# Patient Record
Sex: Female | Born: 1940 | Race: White | Hispanic: No
Health system: Southern US, Community
[De-identification: ages and names within clinical notes are randomized; demographics above are authoritative.]

---

## 2005-07-26 ENCOUNTER — Emergency Department: Payer: Self-pay | Admitting: Emergency Medicine

## 2005-07-26 ENCOUNTER — Other Ambulatory Visit: Payer: Self-pay

## 2005-07-27 ENCOUNTER — Ambulatory Visit: Payer: Self-pay | Admitting: Emergency Medicine

## 2005-08-01 ENCOUNTER — Ambulatory Visit: Payer: Self-pay | Admitting: General Surgery

## 2009-05-14 ENCOUNTER — Ambulatory Visit: Payer: Self-pay | Admitting: Family Medicine

## 2009-05-17 ENCOUNTER — Inpatient Hospital Stay: Payer: Self-pay | Admitting: Internal Medicine

## 2009-05-22 ENCOUNTER — Emergency Department: Payer: Self-pay | Admitting: Emergency Medicine

## 2010-02-22 ENCOUNTER — Encounter: Payer: Self-pay | Admitting: Pulmonary Disease

## 2010-03-17 ENCOUNTER — Encounter: Payer: Self-pay | Admitting: Pulmonary Disease

## 2010-04-16 ENCOUNTER — Encounter: Payer: Self-pay | Admitting: Pulmonary Disease

## 2010-05-17 ENCOUNTER — Encounter: Payer: Self-pay | Admitting: Pulmonary Disease

## 2010-06-16 ENCOUNTER — Encounter: Payer: Self-pay | Admitting: Pulmonary Disease

## 2011-09-18 DIAGNOSIS — C44721 Squamous cell carcinoma of skin of unspecified lower limb, including hip: Secondary | ICD-10-CM | POA: Insufficient documentation

## 2012-10-29 DIAGNOSIS — M25519 Pain in unspecified shoulder: Secondary | ICD-10-CM | POA: Insufficient documentation

## 2012-10-29 DIAGNOSIS — G8929 Other chronic pain: Secondary | ICD-10-CM | POA: Insufficient documentation

## 2012-11-20 DIAGNOSIS — M810 Age-related osteoporosis without current pathological fracture: Secondary | ICD-10-CM | POA: Insufficient documentation

## 2013-02-05 DIAGNOSIS — J449 Chronic obstructive pulmonary disease, unspecified: Secondary | ICD-10-CM | POA: Insufficient documentation

## 2013-05-05 DIAGNOSIS — Z79899 Other long term (current) drug therapy: Secondary | ICD-10-CM | POA: Insufficient documentation

## 2013-06-27 DIAGNOSIS — R0602 Shortness of breath: Secondary | ICD-10-CM | POA: Insufficient documentation

## 2013-10-21 ENCOUNTER — Encounter: Payer: Self-pay | Admitting: Pulmonary Disease

## 2013-11-14 ENCOUNTER — Encounter: Payer: Self-pay | Admitting: Pulmonary Disease

## 2014-04-29 DIAGNOSIS — G894 Chronic pain syndrome: Secondary | ICD-10-CM | POA: Insufficient documentation

## 2014-07-15 ENCOUNTER — Other Ambulatory Visit: Payer: Self-pay | Admitting: Physician Assistant

## 2014-07-15 ENCOUNTER — Inpatient Hospital Stay: Payer: Self-pay | Admitting: Internal Medicine

## 2014-07-15 DIAGNOSIS — J9620 Acute and chronic respiratory failure, unspecified whether with hypoxia or hypercapnia: Secondary | ICD-10-CM

## 2014-07-15 DIAGNOSIS — I349 Nonrheumatic mitral valve disorder, unspecified: Secondary | ICD-10-CM

## 2014-07-15 DIAGNOSIS — I214 Non-ST elevation (NSTEMI) myocardial infarction: Secondary | ICD-10-CM

## 2014-07-15 LAB — COMPREHENSIVE METABOLIC PANEL
ALBUMIN: 2.5 g/dL — AB (ref 3.4–5.0)
Alkaline Phosphatase: 71 U/L
Anion Gap: 6 — ABNORMAL LOW (ref 7–16)
BUN: 14 mg/dL (ref 7–18)
Bilirubin,Total: 0.8 mg/dL (ref 0.2–1.0)
Calcium, Total: 8.5 mg/dL (ref 8.5–10.1)
Chloride: 102 mmol/L (ref 98–107)
Co2: 29 mmol/L (ref 21–32)
Creatinine: 0.88 mg/dL (ref 0.60–1.30)
EGFR (African American): >60
Glucose: 115 mg/dL — ABNORMAL HIGH (ref 65–99)
Osmolality: 275 (ref 275–301)
Potassium: 3.6 mmol/L (ref 3.5–5.1)
SGOT(AST): 29 U/L (ref 15–37)
SGPT (ALT): 28 U/L
Sodium: 137 mmol/L (ref 136–145)
Total Protein: 6.5 g/dL (ref 6.4–8.2)

## 2014-07-15 LAB — CBC WITH DIFFERENTIAL/PLATELET
Basophil #: 0 10*3/uL (ref 0.0–0.1)
Basophil %: 0.3 %
Eosinophil #: 0.1 10*3/uL (ref 0.0–0.7)
Eosinophil %: 0.4 %
HCT: 38.5 % (ref 35.0–47.0)
HGB: 12.3 g/dL (ref 12.0–16.0)
LYMPHS ABS: 0.7 10*3/uL — AB (ref 1.0–3.6)
LYMPHS PCT: 5.3 %
MCH: 30.4 pg (ref 26.0–34.0)
MCHC: 32 g/dL (ref 32.0–36.0)
MCV: 95 fL (ref 80–100)
Monocyte #: 1.4 x10 3/mm — ABNORMAL HIGH (ref 0.2–0.9)
Monocyte %: 11 %
NEUTROS PCT: 83 %
Neutrophil #: 10.7 10*3/uL — ABNORMAL HIGH (ref 1.4–6.5)
Platelet: 196 10*3/uL (ref 150–440)
RBC: 4.06 10*6/uL (ref 3.80–5.20)
RDW: 13.7 % (ref 11.5–14.5)
WBC: 12.9 10*3/uL — AB (ref 3.6–11.0)

## 2014-07-15 LAB — PROTIME-INR
INR: 1
Prothrombin Time: 13.5 secs (ref 11.5–14.7)

## 2014-07-15 LAB — CK-MB
CK-MB: 4.4 ng/mL — ABNORMAL HIGH (ref 0.5–3.6)
CK-MB: 7.4 ng/mL — ABNORMAL HIGH (ref 0.5–3.6)
CK-MB: 9.7 ng/mL — AB (ref 0.5–3.6)

## 2014-07-15 LAB — MAGNESIUM: Magnesium: 1.7 mg/dL — ABNORMAL LOW

## 2014-07-15 LAB — APTT: Activated PTT: 36.5 secs — ABNORMAL HIGH (ref 23.6–35.9)

## 2014-07-15 LAB — TROPONIN I
Troponin-I: 1.5 ng/mL — ABNORMAL HIGH
Troponin-I: 1.5 ng/mL — ABNORMAL HIGH
Troponin-I: 1.2 ng/mL — ABNORMAL HIGH

## 2014-07-15 LAB — PHOSPHORUS: Phosphorus: 2.9 mg/dL (ref 2.5–4.9)

## 2014-07-16 DIAGNOSIS — I340 Nonrheumatic mitral (valve) insufficiency: Secondary | ICD-10-CM

## 2014-07-16 DIAGNOSIS — I214 Non-ST elevation (NSTEMI) myocardial infarction: Secondary | ICD-10-CM

## 2014-07-16 LAB — BASIC METABOLIC PANEL
Anion Gap: 8 (ref 7–16)
BUN: 13 mg/dL (ref 7–18)
CALCIUM: 8.6 mg/dL (ref 8.5–10.1)
CHLORIDE: 108 mmol/L — AB (ref 98–107)
CREATININE: 0.93 mg/dL (ref 0.60–1.30)
Co2: 25 mmol/L (ref 21–32)
Glucose: 322 mg/dL — ABNORMAL HIGH (ref 65–99)
Osmolality: 294 (ref 275–301)
Potassium: 3.5 mmol/L (ref 3.5–5.1)
SODIUM: 141 mmol/L (ref 136–145)

## 2014-07-16 LAB — LIPID PANEL
Cholesterol: 125 mg/dL (ref 0–200)
HDL Cholesterol: 34 mg/dL — ABNORMAL LOW (ref 40–60)
LDL CHOLESTEROL, CALC: 75 mg/dL (ref 0–100)
TRIGLYCERIDES: 81 mg/dL (ref 0–200)
VLDL CHOLESTEROL, CALC: 16 mg/dL (ref 5–40)

## 2014-07-16 LAB — CBC WITH DIFFERENTIAL/PLATELET
BASOS ABS: 0 10*3/uL (ref 0.0–0.1)
Basophil %: 0.2 %
Eosinophil #: 0 10*3/uL (ref 0.0–0.7)
Eosinophil %: 0 %
HCT: 39.3 % (ref 35.0–47.0)
HGB: 12.7 g/dL (ref 12.0–16.0)
LYMPHS ABS: 0.4 10*3/uL — AB (ref 1.0–3.6)
Lymphocyte %: 4.6 %
MCH: 30.5 pg (ref 26.0–34.0)
MCHC: 32.2 g/dL (ref 32.0–36.0)
MCV: 95 fL (ref 80–100)
MONOS PCT: 3.7 %
Monocyte #: 0.3 x10 3/mm (ref 0.2–0.9)
NEUTROS ABS: 8.4 10*3/uL — AB (ref 1.4–6.5)
Neutrophil %: 91.5 %
PLATELETS: 203 10*3/uL (ref 150–440)
RBC: 4.15 10*6/uL (ref 3.80–5.20)
RDW: 13.8 % (ref 11.5–14.5)
WBC: 9.2 10*3/uL (ref 3.6–11.0)

## 2014-07-16 LAB — HEPARIN LEVEL (UNFRACTIONATED)
Anti-Xa(Unfractionated): <0.1 IU/mL — ABNORMAL LOW (ref 0.30–0.70)
Anti-Xa(Unfractionated): 0.13 IU/mL — ABNORMAL LOW (ref 0.30–0.70)

## 2014-07-16 LAB — HEMOGLOBIN A1C: HEMOGLOBIN A1C: 6.2 % (ref 4.2–6.3)

## 2014-07-17 DIAGNOSIS — J9690 Respiratory failure, unspecified, unspecified whether with hypoxia or hypercapnia: Secondary | ICD-10-CM | POA: Insufficient documentation

## 2014-07-17 LAB — CBC WITH DIFFERENTIAL/PLATELET
BASOS ABS: 0 10*3/uL (ref 0.0–0.1)
Basophil %: 0.2 %
EOS ABS: 0 10*3/uL (ref 0.0–0.7)
Eosinophil %: 0.1 %
HCT: 33.5 % — AB (ref 35.0–47.0)
HGB: 10.8 g/dL — ABNORMAL LOW (ref 12.0–16.0)
LYMPHS ABS: 1.5 10*3/uL (ref 1.0–3.6)
Lymphocyte %: 9.6 %
MCH: 30.4 pg (ref 26.0–34.0)
MCHC: 32.2 g/dL (ref 32.0–36.0)
MCV: 94 fL (ref 80–100)
MONO ABS: 1.3 x10 3/mm — AB (ref 0.2–0.9)
Monocyte %: 7.9 %
NEUTROS ABS: 13.1 10*3/uL — AB (ref 1.4–6.5)
Neutrophil %: 82.2 %
PLATELETS: 151 10*3/uL (ref 150–440)
RBC: 3.54 10*6/uL — ABNORMAL LOW (ref 3.80–5.20)
RDW: 13.6 % (ref 11.5–14.5)
WBC: 16 10*3/uL — AB (ref 3.6–11.0)

## 2014-07-17 LAB — BASIC METABOLIC PANEL
ANION GAP: 6 — AB (ref 7–16)
BUN: 11 mg/dL (ref 7–18)
CO2: 32 mmol/L (ref 21–32)
CREATININE: 0.8 mg/dL (ref 0.60–1.30)
Calcium, Total: 7.9 mg/dL — ABNORMAL LOW (ref 8.5–10.1)
Chloride: 104 mmol/L (ref 98–107)
EGFR (African American): >60
EGFR (Non-African Amer.): >60
GLUCOSE: 225 mg/dL — AB (ref 65–99)
Osmolality: 290 (ref 275–301)
Potassium: 3.2 mmol/L — ABNORMAL LOW (ref 3.5–5.1)
Sodium: 142 mmol/L (ref 136–145)

## 2014-07-17 LAB — MAGNESIUM: MAGNESIUM: 1.6 mg/dL — AB

## 2014-07-17 LAB — URINE CULTURE

## 2014-07-20 ENCOUNTER — Encounter: Payer: Self-pay | Admitting: Cardiovascular Disease

## 2014-07-20 LAB — CULTURE, BLOOD (SINGLE)

## 2014-07-21 DIAGNOSIS — J189 Pneumonia, unspecified organism: Secondary | ICD-10-CM | POA: Insufficient documentation

## 2014-07-21 DIAGNOSIS — J441 Chronic obstructive pulmonary disease with (acute) exacerbation: Secondary | ICD-10-CM | POA: Insufficient documentation

## 2014-09-22 ENCOUNTER — Encounter
Admit: 2014-09-22 | Disposition: A | Discharge status: Home / Self Care | Payer: Self-pay | Attending: Pulmonary Disease | Admitting: Pulmonary Disease

## 2014-10-16 ENCOUNTER — Encounter
Admit: 2014-10-16 | Disposition: A | Discharge status: Home / Self Care | Payer: Self-pay | Attending: Pulmonary Disease | Admitting: Pulmonary Disease

## 2014-11-03 DIAGNOSIS — R928 Other abnormal and inconclusive findings on diagnostic imaging of breast: Secondary | ICD-10-CM | POA: Insufficient documentation

## 2014-11-07 NOTE — Consult Note (Signed)
General Aspect Primary Cardiologist: New to [facility] __________________  74 year old female with history of COPD, diastolic dysfunction, osteoporosis, chronic renal inisufficiency, history of SCC, and chronic left shoulder pain who presented to [facility] today with increased SOB for the past 2 days after recently dealing with cold-like symptoms for the past 1 week. Upon her arrival to the [facility] ED she was found to have a right sided PNA as well as a pleural effusion. Troponin was found to be 1.50. EKG sinus tachycardia, 110 bpm, nonspecific inferior st/t changes. Cardiology was consulted for further evaluation of her elevated troponin.  _________________  PMH: 1. COPD - on prn O2 (mostly at night) 2L 2. Diastolic dysfunction 3. Osteoporosis 4. Chronic renal insufficiency 5. History of SCC 6. Chronic left shoulder pain 7. Mitral valve disease _________________   Present Illness 74 year old female with the above problem list who presented to [facility] today with increased SOB for the past 2 days after recently dealing with cold-like symptoms for the past 1 week. Upon her arrival to the [facility] ED she was found to have a right sided PNA as well as a pleural effusion. Troponin was found to be 1.50. EKG sinus tachycardia, 110 bpm, nonspecific inferior st/t changes.  She has no known history of CAD. No prior stress tests or cardiac catheterizations. She has undergone several echos (done through [facility]) ordered by PCP for SOB. Most recently on 06/27/2013 that showed an EF of 10-93%, diastolic dysfunction, degenerative mitral valve, mitral valve annular calcification, mild MR, and small pericardial effusion. She that time her symptoms seemed to have stablized and no further workup was pursued as an outpatient or with cardiology. Her blood pressure has been borderline hypertensive at times, not on antihypertensive at home.   She presented to [facility] as above. She has a history of catching colds easily. She was around her  grandkids, who were sick at the time and she apparently got a cold. She called her PCP at the start of the week, 12/28, and was called in doxycycline. She has taken 3 doses to date. She has noticed that her O2 requirements have been quite a bit more frequent, though she is still on 2L. While on 2L her pulse ox will be in the mide to upper 80s. On room air her pulse ox will drop into the 60s. Over the past 2 days her SOB was becoming worse prompting her to come in for evaluation. She denies any chest pain, palpitations, nausea, presyncope, syncope, or diaphoresis. She did have a couple episodes of vomiting. This morning she was noted to have a Tmax of 102.5 which resolved with Tylenol.   Upon her arrival to [facility] she was noted to have a right sided PNA as well as a pleural effusion (similar to previous episode in 2010). Troponin was found to be 1.50. EKG sinus tachycardia, 110 bpm, nonspecific inferior st/t changes. CT of the chest confirms PNA and bilateral pleural effusions. It also notes mediastinal and hilar lymphadenopathy - follow up recommended. She was started on empiric antibiotics. She is currently resting comfortably on O2 in the ED waiting to go up to the CCU.   Physical Exam:  GEN well developed, well nourished, no acute distress   HEENT hearing intact to voice   NECK supple   RESP normal resp effort  wheezing  diffuse rhonchi, right lung base with decreased breath sounds   CARD Regular rate and rhythm  Murmur   Murmur Systolic  2/6 apex  ABD denies tenderness  soft   EXTR negative edema   SKIN normal to palpation   NEURO cranial nerves intact   PSYCH alert, A+O to time, place, person, good insight   Review of Systems:  General: Fatigue  Fever/chills  Weakness   Skin: No Complaints   ENT: Sore Throat  congestion   Eyes: No Complaints   Neck: No Complaints   Respiratory: Frequent cough  Short of breath  Wheezing  Sputum   Cardiovascular: No Complaints    Gastrointestinal: Nausea   Genitourinary: No Complaints   Vascular: No Complaints   Musculoskeletal: No Complaints   Neurologic: No Complaints   Hematologic: No Complaints   Endocrine: No Complaints   Psychiatric: No Complaints   Review of Systems: All other systems were reviewed and found to be negative   Medications/Allergies Reviewed Medications/Allergies reviewed   Family & Social History:  Family and Social History:  Family History Hypertension  father: lymphoma; mother: ovarian cancer   Social History negative ETOH, negative Illicit drugs   + Tobacco Prior (greater than 1 year)  quit 2010   Place of Living Home     Pneumonia:    Pleural Effusion:    chronic shoulder pain:    Osteoporosis:    Cholecystectomy: 2007   Tubal Ligation:    Shoulder Surgery - Left:   Home Medications: Medication Instructions Status  doxycycline hyclate 100 mg oral capsule 1 cap(s) orally 2 times a day Active  baclofen 10 mg oral tablet 1 tab(s) orally at bedtime Active  Atrovent HFA CFC free 17 mcg/inh inhalation aerosol 2 puff(s) inhaled 3 times a day Active  Vitamin D3 2000 intl units oral tablet 1 tab(s) orally once a day Active  Evista 60 mg oral tablet 1 tab(s) orally once a day (at bedtime) Active  fluticasone nasal 50 mcg/inh nasal spray 1 spray(s) nasal once a day, As Needed for nasal congestion.  Active  gabapentin 300 mg oral capsule 3 cap(s) orally once a day (at bedtime) Active  magnesium citrate 125 mg oral capsule 3 cap(s) orally once a day (at bedtime) Active  Advair HFA CFC free 115 mcg-21 mcg/inh inhalation aerosol 2 puff(s) inhaled 2 times a day Active  OxyCONTIN 10 mg oral tablet, extended release 1 tab(s) orally once a day (in the morning), 1 in the afternoon, and 2 at bedtime.  Active   Lab Results:  Hepatic:  30-Dec-15 12:22   Bilirubin, Total 0.8  Alkaline Phosphatase 71 (46-116 NOTE: New Reference Range 02/03/14)  SGPT (ALT) 28 (14-63 NOTE: New  Reference Range 02/03/14)  SGOT (AST) 29  Total Protein, Serum 6.5  Albumin, Serum  2.5  Routine Chem:  30-Dec-15 12:22   Glucose, Serum  115  BUN 14  Creatinine (comp) 0.88  Sodium, Serum 137  Potassium, Serum 3.6  Chloride, Serum 102  CO2, Serum 29  Calcium (Total), Serum 8.5  Osmolality (calc) 275  eGFR (African American) >60  eGFR (Non-African American) >60 (eGFR values <6m/min/1.73 m2 may be an indication of chronic kidney disease (CKD). Calculated eGFR, using the MRDR Study equation, is useful in  patients with stable renal function. The eGFR calculation will not be reliable in acutely ill patients when serum creatinine is changing rapidly. It is not useful in patients on dialysis. The eGFR calculation may not be applicable to patients at the low and high extremes of body sizes, pregnant women, and vegetarians.)  Anion Gap  6  Magnesium, Serum  1.7 (1.8-2.4 THERAPEUTIC RANGE: 4-7  mg/dL TOXIC: > 10 mg/dL  -----------------------)  Result Comment TROPONIN - RESULTS VERIFIED BY REPEAT TESTING.  - C/STEPHANIE RUDD AT 1322 07/15/14-DAS  - READ-BACK PROCESS PERFORMED.  Result(s) reported on 15 Jul 2014 at 01:13PM.  Phosphorus, Serum 2.9 (Result(s) reported on 15 Jul 2014 at 01:28PM.)  Cardiac:  30-Dec-15 12:22   CPK-MB, Serum  4.4 (Result(s) reported on 15 Jul 2014 at 01:58PM.)  Troponin I  1.50 (0.00-0.05 0.05 ng/mL or less: NEGATIVE  Repeat testing in 3-6 hrs  if clinically indicated. >0.05 ng/mL: POTENTIAL  MYOCARDIAL INJURY. Repeat  testing in 3-6 hrs if  clinically indicated. NOTE: An increase or decrease  of 30% or more on serial  testing suggests a  clinically important change)  Routine Coag:  30-Dec-15 12:22   Activated PTT (APTT)  36.5 (A HCT value >55% may artifactually increase the APTT. In one study, the increase was an average of 19%. Reference: "Effect on Routine and Special Coagulation Testing Values of Citrate Anticoagulant Adjustment in  Patients with High HCT Values." American Journal of Clinical Pathology 2006;126:400-405.)  Prothrombin 13.5  INR 1.0 (INR reference interval applies to patients on anticoagulant therapy. A single INR therapeutic range for coumarins is not optimal for all indications; however, the suggested range for most indications is 2.0 - 3.0. Exceptions to the INR Reference Range may include: Prosthetic heart valves, acute myocardial infarction, prevention of myocardial infarction, and combinations of aspirin and anticoagulant. The need for a higher or lower target INR must be assessed individually. Reference: The Pharmacology and Management of the Vitamin K  antagonists: the seventh ACCP Conference on Antithrombotic and Thrombolytic Therapy. IOMBT.5974 Sept:126 (3suppl): [id number]. A HCT value >55% may artifactually increase the PT.  In one study,  the increase was an average of 25%. Reference:  "Effect on Routine and Special Coagulation Testing Values of Citrate Anticoagulant Adjustment in Patients with High HCT Values." American Journal of Clinical Pathology 2006;126:400-405.)  Routine Hem:  30-Dec-15 12:22   WBC (CBC)  12.9  RBC (CBC) 4.06  Hemoglobin (CBC) 12.3  Hematocrit (CBC) 38.5  Platelet Count (CBC) 196  MCV 95  MCH 30.4  MCHC 32.0  RDW 13.7  Neutrophil % 83.0  Lymphocyte % 5.3  Monocyte % 11.0  Eosinophil % 0.4  Basophil % 0.3  Neutrophil #  10.7  Lymphocyte #  0.7  Monocyte #  1.4  Eosinophil # 0.1  Basophil # 0.0 (Result(s) reported on 15 Jul 2014 at 12:42PM.)   EKG:  EKG Interp. by me   Interpretation sinus tachycardia, 110 bpm, nonspecific st/t changes inferior leads   Radiology Results: XRay:    30-Dec-15 12:55, Chest Portable Single View  Chest Portable Single View   REASON FOR EXAM:    Sepsis  COMMENTS:       PROCEDURE: DXR - DXR PORTABLE CHEST SINGLE VIEW  - Jul 15 2014 12:55PM     CLINICAL DATA:  Fever, cough.    EXAM:  PORTABLE CHEST - 1  VIEW    COMPARISON:  May 22, 2009.    FINDINGS:  Stable cardiomediastinal silhouette. No pneumothorax is noted. Right  lower lobe opacity is noted concerning for pneumonia with mild  associated pleural effusion. Mild left pleural effusion is noted as  well. Surgical screws are noted in the left proximal humerus.     IMPRESSION:  Right lower lobe opacity is noted most consistent with pneumonia  with associated pleural effusion. Follow-up radiographs are  recommended to ensure resolution and rule out underlying  neoplasm or  mass.      Electronically Signed    By: Sabino Dick M.D.    On: 07/15/2014 13:21       Verified By: Marveen Reeks, M.D.,  CT:    30-Dec-15 15:32, CT Chest Without Contrast  CT Chest Without Contrast   REASON FOR EXAM:    sob effusion  COMMENTS:       PROCEDURE: CT  - CT CHEST WITHOUT CONTRAST  - Jul 15 2014  3:32PM     CLINICAL DATA:  Shortness of breath, productive cough, nausea and  vomiting, lethargy, and general malaise for the past week. Hypoxia.  Febrile.    EXAM:  CT CHEST WITHOUT CONTRAST    TECHNIQUE:  Multidetector CT imaging of the chest was performed following the  standard protocol without IV contrast..  COMPARISON:  Chest radiograph 07/15/2014 and CT 05/18/2009    FINDINGS:  Enlarged mediastinal lymph nodes are new from the prior CT, with  right paratracheal lymph nodes measuring up to 1.2 cm in short axis,  an AP window lymph node measuring 1.2 cm, and subcarinal lymph nodes  measuring up to 1.7 cm. Subcarinal lymph nodes result in a minimal  impression on the bronchus intermedius. There are likely mildly  enlarged hilar lymph nodes bilaterally, although evaluation is  limited by lack of IV contrast. Right hilar/peribronchial lymph  nodes measure up to at least 1.2 cm inshort axis. Three-vessel  coronary artery calcifications are noted. Heart size is normal.  Moderate aortic calcification is noted.    There is no  pericardial effusion. There are small bilateral pleural  effusions. Fluid extends into the right major fissure. There is  likely mild pleural thickening bilaterally without discrete pleural  nodularityidentified. Moderate centrilobular emphysema is noted.  There are multiple regions of dense consolidation in the right lower  lobe corresponding to abnormality on radiographs earlier today. Less  extensive consolidation is present in the right middle lobe. Patchy  opacities are present in the lingula, and there is dependent  atelectasis versus consolidation in the left lower lobe.    Visualized portion of the upper abdomen demonstrates cholecystectomy  clips. Common bile duct dilatation is incompletely visualized but  may be secondary to prior cholecystectomy. There is a small sliding  hiatal hernia. No acute osseous abnormality is identified.     IMPRESSION:  1. Right lower lobe greater than right middle lobe consolidation  consistent with pneumonia. Patchy lingular opacities could reflect  additional infection.  2. Left lower lobe atelectasis versus pneumonia.  3. Small bilateral pleural effusions.  4. New mediastinal and hilar lymphadenopathy. This may be reactive  in the setting of infection, although an underlying malignancy is  possible and could be obscured by the lung consolidation. Follow-up  chest CT is recommended after treatment in 6 weeks to ensure  resolution and exclude an underlying lung cancer.      Electronically Signed    By: Logan Bores    On: 07/15/2014 15:59     Verified By: Ferol Luz, M.D.,    Penicillin: Rash  Betadine: Rash, Other  Levaquin: Other  Spiriva: Blisters   Impression 74 year old female with history of COPD, diastolic dysfunction, osteoporosis, chronic renal inisufficiency, history of SCC, and chronic left shoulder pain who presented to [facility] today with increased SOB for the past 2 days after recently dealing with cold-like symptoms for  the past 1 week. Upon her arrival to the [facility] ED she was found  to have a right sided PNA as well as a pleural effusion. Troponin was found to be 1.50. EKG sinus tachycardia, 110 bpm, nonspecific inferior st/t changes. Cardiology was consulted for further evaluation of her elevated troponin.   1. NSTEMI: -Initial troponin 1.50 -Check echo to evaluate LV function and wall motion (prior 06/2013: EF 02-77%, diastolic dysfunction, mild MR) -Heparin gtt - cardiac cath pneumonia is improved.  -Lipitor -still hypotensive.  hold starting of b-blocker  2. Acute on chronic repiratory failure: -Secondary to right lower lobe, right middle lobePNA, left lower lobe PNA vs atelectasis, and lingular opacticites c/w infection -Continue antibiotics, inhalers, and steroids per IM  3. Mitral valve disease: -Echo as above  4. History of chronic renal insufficiency: -SCr stable currently   Plan The patient was seen and examined. The note was modified by me as needed.   Electronic Signatures: Kathlyn Sacramento (MD)  (Signed 30-Dec-15 17:58)  Authored: Impression/Plan  Co-Signer: General Aspect/Present Illness, History and Physical Exam, Review of System, Family & Social History, Past Medical History, Home Medications, Labs, EKG , Radiology, Allergies, Impression/Plan Adley Mazurowski M (PA-C)  (Signed 30-Dec-15 17:23)  Authored: General Aspect/Present Illness, History and Physical Exam, Review of System, Family & Social History, Past Medical History, Home Medications, Labs, EKG , Radiology, Allergies, Impression/Plan   Last Updated: 30-Dec-15 17:58 by Kathlyn [place] (MD)

## 2014-11-11 DIAGNOSIS — C50419 Malignant neoplasm of upper-outer quadrant of unspecified female breast: Secondary | ICD-10-CM | POA: Insufficient documentation

## 2014-11-11 NOTE — H&P (Signed)
PATIENT NAME:  Cindy Cole, Cindy Cole MR#:  [id number] DATE OF BIRTH:  [birth year]  DATE OF ADMISSION:  07/15/2014  REFERRING PHYSICIAN: Sheryl L. Benjaman Lobe, MD.   CHIEF COMPLAINT: Shortness of breath, cough, fever.   HISTORY OF PRESENT ILLNESS: The patient is a 74 year old white female with history of COPD who states that she has been having cold-like symptoms ongoing for the past few days and then shortness of breath for the past 2 days. She called her primary care provider on Monday and was started on doxycycline. However, her symptoms continued to persist and then she started having fevers. Due to the fevers she undecided come to the ER. The patient came to the ER and was noted to have a chest x-ray that showed a right-sided pneumonia, as well as associated pleural effusion. The patient had a similar type of presentation with associated effusion in November 2010. The patient also has been having some wheezing and has chronic dyspnea which is worse over the past few days. She also has had a fever since last night. Has not had any chills.  She denies any chest pains. Denies any nausea, vomiting or diarrhea. Denies any urinary symptoms. She does have chronic left shoulder pain and has had multiple surgeries to that shoulder.   PAST MEDICAL HISTORY: 1.  History of COPD.   2.  History of osteoporosis.  3.  History of chronic left-sided shoulder pain.   PAST SURGICAL HISTORY: Left shoulder repair x 5, cholecystectomy.   ALLERGIES: PENICILLIN, LEVAQUIN, BETADINE, SPIRIVA.   SOCIAL HISTORY: He quit smoking in 2010. No alcohol or drug use.   FAMILY HISTORY: Positive for hypertension and lymphoma in father. Mother had ovarian cancer.   REVIEW OF SYSTEMS:  CONSTITUTIONAL: Complains of fever, fatigue, weakness. No weight loss. No weight gain.  EYES: No blurred or double vision. No redness. No inflammation.  ENT: No tinnitus. No ear pain. No hearing loss. No seasonal or year-round allergies. No epistaxis.   RESPIRATORY: Complains of cough, wheezing. Has COPD.   CARDIOVASCULAR: Denies any chest pain, orthopnea, edema, or arrhythmia.  GASTROINTESTINAL: No nausea, vomiting, diarrhea. No abdominal pain. No hematemesis. No melena.  GENITOURINARY: Denies any dysuria, hematuria, renal colic or frequency.  ENDOCRINE: Denies any polyuria, nocturia, thyroid problems.  HEMATOLOGIC AND LYMPHATIC: Denies anemia, easy bruisability or bleeding.  SKIN: No acne. No rash.  MUSCULOSKELETAL: Has chronic shoulder pain.  NEUROLOGIC: No CVA, TIA or seizures.  PSYCHIATRIC: No anxiety, insomnia, or ADD.   PHYSICAL EXAMINATION: VITAL SIGNS: Temperature 102.4, pulse 112, respirations 26, blood pressure 101/61.  GENERAL: The patient is a well-developed female in no acute distress.  HEENT: Head atraumatic, normocephalic. Pupils equally round, reactive to light and accommodation. There is no conjunctival pallor. No scleral icterus. Nasal exam shows no drainage or ulceration. Oropharynx clear without any exudate.  NECK: Supple without any thyromegaly.  CARDIOVASCULAR: Regular rate and rhythm. No murmurs, rubs, clicks, or gallops.  LUNGS: There is no accessory muscle usage. She has occasional wheezing. She has a right lung base that is diminished in breath sounds.  ABDOMEN: Soft, nontender, nondistended. Positive bowel sounds x 4. No hepatosplenomegaly.  EXTREMITIES: No clubbing, cyanosis, or edema.  SKIN: No rash.  LYMPH NODES: Nonpalpable.  VASCULAR: Good DP/PT pulses.  PSYCHIATRIC: Not anxious or depressed.  NEUROLOGIC: Awake, alert, oriented x 3. No focal deficits.   EVALUATIONS: Glucose 115, BUN 14, creatinine 0.88, sodium 137, potassium 3.6, chloride 102, CO2 is 29, magnesium 1.9. LFTs: Total protein 6.5, albumin 2.5, bilirubin  total 0.8. Troponin 1.50, CK-MB 4.4. WBC 12.9, hemoglobin 12.3, platelet count 196,000. INR 1.0. Chest x-ray shows right-sided pneumonia with associated effusion.   ASSESSMENT AND PLAN: The  patient is a 74 year old white female with history of chronic obstructive pulmonary disease, chronic left shoulder pain presents with shortness of breath, fever.  1.  Acute respiratory failure due to a combination of pneumonia and chronic obstructive pulmonary disease flare with associated pleural effusion.  2.  History of similar type of effusion in the past. Due to this fact and her smoking history, I will get a CT scan of the chest to make sure she does not have an underlying mass or any other abnormalities. We will treat her with IV ceftriaxone and azithromycin. Will also, in light of elevated troponin, get an echocardiogram of the heart. We will also treat her with IV Solu-Medrol nebulizers and continue her Advair as taking at home.  3.  Elevated troponin, possibly non-ST myocardial infarction or demand ischemia. At this time, we will treat her with aspirin due to low blood pressure. I will not be able to use any other metoprolol. I will start her on atorvastatin, check a fasting lipid panel in the a.m. Cardiology consult will be obtained will get an echocardiogram.  4.  Acute on chronic obstructive pulmonary disease flare. We will treat her with nebulizers, steroids, and continue inhalers as taking at home.  5.  Miscellaneous. The patient will be on Lovenox for deep vein thrombosis prophylaxis.   TIME SPENT: 60 minutes on this patient.    ____________________________ Lafonda Mosses. Posey Pronto, MD shp:at D: 07/15/2014 14:38:49 ET T: 07/15/2014 14:54:37 ET JOB#: [id number]  cc: Muriel Wilber H. Posey Pronto, MD, <Dictator> Alric Seton MD ELECTRONICALLY SIGNED 07/19/2014 12:23

## 2014-11-11 NOTE — Discharge Summary (Signed)
PATIENT NAME:  Cindy Cole, Cindy Cole MR#:  [id number] DATE OF BIRTH:  [birth year]  DATE OF ADMISSION:  07/15/2014 DATE OF DISCHARGE-TRANSFER: 07/17/2014     ADMITTING DIAGNOSIS: Shortness of breath, cough, fever.   TRANSFER DIAGNOSES:  1.  Cough, shortness of breath, fever due to pneumonia.  2.  Acute hypoxic, hypercarbic respiratory failure due to a combination of bilateral pneumonia, possible diastolic congestive heart failure as well as acute chronic obstructive pulmonary disease flare.  3.  Acute on chronic obstructive pulmonary disease exacerbation.   4.  Bilateral small effusion.  5.  Possible non-ST myocardial infarction, was treated with heparin but due to hemoglobin dropping her heparin is discontinued.  Cardiac catheterization was offered to the patient but she deferred. Further evaluation will needed to be done.  6.  Sinus tachycardia related to acute respiratory failure.  7.  Hyperglycemia, likely due to steroids with hemoglobin A1c being 5.7.  8.  History of osteoporosis.  9.  Chronic left-sided shoulder pain.  10. History of pleural effusion status post thoracentesis in the past.  11.  Status post cholecystectomy.   CONSULTANTS: Dr. Fletcher Anon.   PERTINENT LABORATORY DATA AND EVALUATIONS: Admitting glucose 115, BUN 14, creatinine 0.88, sodium 137, potassium 3.6, chloride 102, CO2 of 29, magnesium 1.7. LFTs were normal, except albumin of 2.5, troponin 1.50, then 1.50 and then 1.2.  WBC 12.9, hemoglobin 12.3, platelet count was 196,000. WBC today is 16.0.  Blood cultures x 2: No growth. Urine cultures no growth. Also, hemoglobin today is 10.8. ABG on admission pH 7.39, pCO2 of 46, pO2 of 62. ABG today pH of 7.30, pCO2 of 58, pO2 of 65. This is on 4 liters of O2  Echocardiogram: Ejection fraction 55% -60%, impaired left ventricular diastolic filling. Mild concentric left ventricular hypertrophy.  CT scan of the chest without contrast showed right lower lobe greater than right, middle lobe  consolidation consistent with pneumonia. Patchy lingual opacities, left lower lobe atelectasis with this pneumonia. Small bilateral pleural effusions, new mediastinal and hilar lymphadenopathy.   HOSPITAL COURSE: Please refer to H and P done by me.  The patient is a 74 year old white female with history of chronic obstructive pulmonary disease, who was having cold-like symptoms ongoing for the past few days, then shortness of breath for 2 days. The patient was started on doxycycline as outpatient, came in with shortness of breath and cough noted to have bilateral pneumonia.  The patient had a CT scan which confirmed the findings.  She was also hypotensive on presentation. Therefore, she was given IV fluids and required Levophed. The patient also was noted to have a troponin elevation of 1.5 and was seen by cardiology. The patient initially was treated with IV azithromycin, ceftriaxone; however today, her respiratory status got worse. She started becoming tachypneic, tachycardic and had to be intubated. Her antibiotics are changed to vancomycin, Zosyn, and kept on azithromycin. Her IV fluids have been discontinued, and she has been given a dose of Lasix.  Chest x-ray does show diastolic congestive heart failure.  At this time, the patient's family requests transfer, which has been arranged.     MEDICATIONS AT THE TIME OF TRANSFER:  Levophed drip 4 mcg per minute, propofol drip for sedation, Tylenol 650 every 4 p.r.n., aspirin 325 p.o. daily, atorvastatin 20 daily, baclofen 10 every 24, vitamin D3 at 2000 international units daily, Flonase 2 sprays both nostril daily, gabapentin 900 at bedtime, Zofran 4 mg IV every 4 p.r.n.,   60 daily, azithromycin 500 IV  every .24 hours, Mucinex 600 b.i.d., chlorhexidine  5 mL every 12, famotidine 20 IV every 12, Lasix 20 IV every 12, vancomycin 750 mg IV every 18 hours.    TIME SPENT: 35 minutes on the patient's transfer.     ____________________________ Lafonda Mosses.  Posey Pronto, MD shp:DT D: 07/17/2014 11:03:00 ET T: 07/17/2014 11:24:26 ET JOB#: [id number]  cc: Roseline Ebarb H. Posey Pronto, MD, <Dictator> Alric Seton MD ELECTRONICALLY SIGNED 07/19/2014 12:23

## 2014-11-16 ENCOUNTER — Encounter: Payer: Medicare Other | Attending: Pulmonary Disease

## 2014-11-16 DIAGNOSIS — J449 Chronic obstructive pulmonary disease, unspecified: Secondary | ICD-10-CM | POA: Insufficient documentation

## 2014-11-18 ENCOUNTER — Encounter: Payer: Medicare Other | Admitting: Respiratory Therapy

## 2014-11-18 DIAGNOSIS — J449 Chronic obstructive pulmonary disease, unspecified: Secondary | ICD-10-CM

## 2014-11-18 NOTE — Progress Notes (Signed)
Daily Session Note  Patient Details  Name: ONA ROEHRS MRN: [id number] Date of Birth: [birth year] Referring Provider:  Tedra Coupe, MD  Encounter Date: 11/18/2014  Check In:     Session Check In - 11/18/14 1129    Check-In   Staff Present Carson Myrtle BS,RRT, Respiratory Therapist; Lestine Box BS, ACSM EP-C, Exercise Physiologist; Candiss Norse MS, ACSM CEP, Exercise Physiologist   ER physicians immediately available to respond to emergencies [facility] immediately available ER MD   Physician(s) Dr. Reita Cliche, Dr. Edd Fabian   Warm-up and Cool-down Performed on first and last piece of equipment   VAD Patient? No   Pain Assessment   Currently in Pain? No/denies         Goals Met:  Proper associated with RPD/PD & O2 Sat Exercise tolerated well  Goals Unmet:  Not Applicable  Goals Comments:    Dr. Emily Filbert is Medical Director for [place] and [facility].

## 2014-11-18 NOTE — Progress Notes (Signed)
Daily Session Note  Patient Details  Name: Cindy Cole MRN: [id number] Date of Birth: 1940/08/13 Referring Provider:  Tedra Coupe, MD  Encounter Date: 11/18/2014  Check In:     Session Check In - 11/18/14 1129    Check-In   Staff Present Carson Myrtle BS,RRT, Respiratory Therapist; Lestine Box BS, ACSM EP-C, Exercise Physiologist; Candiss Norse MS, ACSM CEP, Exercise Physiologist   ER physicians immediately available to respond to emergencies [facility] immediately available ER MD   Physician(s) Dr. Reita Cliche, Dr. Edd Fabian   Warm-up and Cool-down Performed on first and last piece of equipment   VAD Patient? No   Pain Assessment   Currently in Pain? No/denies         Goals Met:  Proper associated with RPD/PD & O2 Sat Independence with exercise equipment Using PLB without cueing & demonstrates good technique Exercise tolerated well  Goals Unmet:  O2 Sat  Goals Comments: During the treadmill, [name]'s O2Sat dropped to 84%; she was stopped and encouraged to do PLB which increased her Sat's to 92-93%; [name] states her day to day symptoms have not changed.   Dr. Emily Filbert is Medical Director for [place] and [facility].

## 2014-11-20 ENCOUNTER — Encounter: Payer: Medicare Other | Admitting: Respiratory Therapy

## 2014-11-20 DIAGNOSIS — J449 Chronic obstructive pulmonary disease, unspecified: Secondary | ICD-10-CM

## 2014-11-20 NOTE — Progress Notes (Signed)
Daily Session Note  Patient Details  Name: Cindy Cole MRN: [id number] Date of Birth: [birth year] Referring Provider:  Tedra Coupe, MD  Encounter Date: 11/20/2014  Check In:     Session Check In - 11/20/14 1100    Check-In   Staff Present Heath Lark RN, BSN, CCRP;Carroll Enterkin RN, BSN;Klara Stjames Blanch Media RRT, RCP Respiratory Therapist;Renee Dillard Essex MS, ACSM CEP Exercise Physiologist   ER physicians immediately available to respond to emergencies [facility] immediately available ER MD   Physician(s) Dr. Karma Greaser and Dr. Reita Cliche   Warm-up and Cool-down Performed on first and last piece of equipment   VAD Patient? No   Pain Assessment   Currently in Pain? No/denies         Goals Met:  Proper associated with RPD/PD & O2 Sat Independence with exercise equipment Exercise tolerated well  Goals Unmet:  Not Applicable  Goals Comments:    Dr. Emily Filbert is Medical Director for [place] and [facility].

## 2014-11-25 ENCOUNTER — Encounter: Payer: Medicare Other | Admitting: *Deleted

## 2014-11-25 DIAGNOSIS — J449 Chronic obstructive pulmonary disease, unspecified: Secondary | ICD-10-CM | POA: Diagnosis not present

## 2014-11-25 NOTE — Progress Notes (Signed)
Discharge Summary  Patient Details  Name: Cindy Cole MRN: [id number] Date of Birth: 1941/01/06 Referring Provider:  Tedra Coupe, MD   Number of Visits: 16  Reason for Discharge:  Early Exit:  Due to breast cancer treatment  Smoking History:  History  Smoking status   Not on file  Smokeless tobacco   Not on file    Diagnosis:  Chronic obstructive pulmonary disease, unspecified COPD, unspecified chronic bronchitis type  Initial Exercise Prescription:   Discharge Exercise Prescription:   Functional Capacity:        ADL [facility]SD      09/22/14 0900       ADL UCSD   ADL Phase Entry     SOB Score total 47     Rest 0     Walk 2     Stairs 3     Bath 1     Dress 1     Shop 2          Psychological, QOL, Others - Outcomes: PHQ 2/9: No flowsheet data found.  Quality of Life:   GD04 Depression Questionnaire Results      Pre 4    Post 0   SF36                                                                   Pre           Post             Physical Fx     0         0                Role Fx: Physical    0   0              Bodily Pain     0   0    General Health    0   0   Vitality     0   0    Social Fx     0   0   Role Fx: Emotional    0   0   Mental Health     0   0  Personal Goals: Goals established at orientation with interventions provided to work toward goal.     Personal Goals and Risk Factors at Admission - 09/22/14 0900    Personal Goals and Risk Factors on Admission    Weight Management Yes   Intervention Learn and follow the exercise and diet guidelines while in the program. Utilize the nutrition and education classes to help gain knowledge of the diet and exercise expectations in the program   Increase Aerobic Exercise and Physical Activity Yes   Intervention While in program, learn and follow the exercise prescription taught. Start at a low level workload and increase workload after able to maintain previous level for 30 minutes.  Increase time before increasing intensity.   Quit Smoking No  Former smoker   Take Less Medication No   Understand more about Heart/Pulmonary Disease. Yes   Intervention While in program utilize professionals for any questions, and attend the education sessions. Great websites to use are www.americanheart.org or www.lung.org for reliable information.   Improve shortness  of breath with ADL's Yes   Intervention While in program, learn and follow the exercise prescription taught. Start at a low level workload and increase workload ad advised by the exercise physiologist. Increase time before increasing intensity.   Develop more efficient breathing techniques such as purse lipped breathing and diaphragmatic breathing; and practicing self-pacing with activity Yes   Intervention While in program, learn and utilize the specific breathing techniques taught to you. Continue to practice and use the techniques as needed.   Increase knowledge of respiratory medications and ability to use respiratory devices properly.  Yes   Intervention While in program, learn to administer MDI, nebulizer, and spacer properly.;Learn to take respiratory medicine as ordered.;While in program, learn to Clean MDI, nebulizers, and spacers properly.   Diabetes No   Hypertension No   Lipids No   Stress No   Personal Goal Other No       Personal Goals Discharge:     Personal Goals at Discharge - 11/25/14 1000    Weight Management   Goals Progress/Improvement seen No   Comments Maintained initial weight   Increase Aerobic Exercise and Physical Activity   Goals Progress/Improvement seen  Yes   Comments Increased intensity with exercise goals   Understand more about Heart/Pulmonary Disease   Goals Progress/Improvement seen  Yes   Comments Attended education classes   Improve shortness of breath with ADL's   Goals Progress/Improvement seen  Yes   Comments Using PLB with exertion; less shortness of breath with daily  activity   Breathing Techniques   Goals Progress/Improvement seen  Yes   Increase knowledge of respiratory medications   Goals Progress/Improvement seen  Yes      Nutrition & Weight - Outcomes:    Nutrition:   Nutrition Discharge:   Education Questionnaire Score:   Goals reviewed with patient; copy given to patient.

## 2014-11-25 NOTE — Addendum Note (Signed)
Addended by: Karie Fetch on: 11/25/2014 04:35 PM   Modules accepted: Medications

## 2014-11-25 NOTE — Progress Notes (Signed)
Daily Session Note  Patient Details  Name: ARTRICE KRAKER MRN: [id number] Date of Birth: March 24, 1941 Referring Provider:  Tedra Coupe, MD  Encounter Date: 11/25/2014  Check In:     Session Check In - 11/25/14 1139    Check-In   Staff Present Laureen Owens Shark BS, RRT, Respiratory Therapist;Renee Dillard Essex MS, ACSM CEP Exercise Physiologist;Steven Way BS, ACSM EP-C, Exercise Physiologist;Susanne Bice RN, BSN, CCRP   ER physicians immediately available to respond to emergencies [facility] immediately available ER MD   Physician(s) Drs: Thomasene Lot and Joni Fears   Medication changes reported     No   Fall or balance concerns reported    No   Warm-up and Cool-down Performed on first and last piece of equipment   VAD Patient? No   Pain Assessment   Currently in Pain? No/denies         Goals Met:  Independence with exercise equipment Exercise tolerated well Strength training completed today  Goals Unmet:  Not Applicable  Goals Comments:    Dr. Emily Filbert is Medical Director for [place] and [facility].

## 2014-12-02 DIAGNOSIS — J449 Chronic obstructive pulmonary disease, unspecified: Secondary | ICD-10-CM | POA: Diagnosis not present

## 2014-12-02 NOTE — Progress Notes (Signed)
Discharge Summary  Patient Details  Name: Cindy Cole MRN: [id number] Date of Birth: 01/10/1941 Referring Provider:  Tedra Coupe, MD   Number of Visits: 16  Reason for Discharge:  Early Exit:  [name] has participated in [place] before, so she plans to enter [employer] to continue her exercise.  Smoking History:  History  Smoking status   Not on file  Smokeless tobacco   Not on file    Diagnosis:  Chronic obstructive pulmonary disease, unspecified COPD, unspecified chronic bronchitis type  Initial Exercise Prescription:   Discharge Exercise Prescription:   Functional Capacity:     6 Minute Walk      09/22/14 0900 09/22/14 0930 12/02/14 1021   6 Minute Walk   Phase Initial Initial Discharge   Distance 1150 feet 1150 feet 1180 feet   Walk Time 6 minutes 6 minutes 6 minutes   Resting HR 93 bpm 93 bpm 104 bpm   Resting BP 142/74 mmHg 142/74 mmHg 100/68 mmHg   Max Ex. HR 141 bpm 141 bpm 144 bpm   Max Ex. BP 168/80 mmHg 168/80 mmHg 144/72 mmHg   RPE 15 15 13    Perceived Dyspnea  5 5 4    Symptoms No No No      Psychological, QOL, Others - Outcomes: PHQ 2/9: No flowsheet data found.  Quality of Life:   Personal Goals: Goals established at orientation with interventions provided to work toward goal.     Personal Goals and Risk Factors at Admission - 09/22/14 0900    Personal Goals and Risk Factors on Admission    Weight Management Yes   Intervention Learn and follow the exercise and diet guidelines while in the program. Utilize the nutrition and education classes to help gain knowledge of the diet and exercise expectations in the program   Increase Aerobic Exercise and Physical Activity Yes   Intervention While in program, learn and follow the exercise prescription taught. Start at a low level workload and increase workload after able to maintain previous level for 30 minutes. Increase time before increasing intensity.   Quit Smoking No  Former  smoker   Take Less Medication No   Understand more about Heart/Pulmonary Disease. Yes   Intervention While in program utilize professionals for any questions, and attend the education sessions. Great websites to use are www.americanheart.org or www.lung.org for reliable information.   Improve shortness of breath with ADL's Yes   Intervention While in program, learn and follow the exercise prescription taught. Start at a low level workload and increase workload ad advised by the exercise physiologist. Increase time before increasing intensity.   Develop more efficient breathing techniques such as purse lipped breathing and diaphragmatic breathing; and practicing self-pacing with activity Yes   Intervention While in program, learn and utilize the specific breathing techniques taught to you. Continue to practice and use the techniques as needed.   Increase knowledge of respiratory medications and ability to use respiratory devices properly.  Yes   Intervention While in program, learn to administer MDI, nebulizer, and spacer properly.;Learn to take respiratory medicine as ordered.;While in program, learn to Clean MDI, nebulizers, and spacers properly.   Diabetes No   Hypertension No   Lipids No   Stress No   Personal Goal Other No       Personal Goals Discharge:     Personal Goals at Discharge - 11/25/14 1000    Weight Management   Goals Progress/Improvement seen No   Comments Maintained initial  weight   Increase Aerobic Exercise and Physical Activity   Goals Progress/Improvement seen  Yes   Comments Increased intensity with exercise goals   Understand more about Heart/Pulmonary Disease   Goals Progress/Improvement seen  Yes   Comments Attended education classes   Improve shortness of breath with ADL's   Goals Progress/Improvement seen  Yes   Comments Using PLB with exertion; less shortness of breath with daily activity   Breathing Techniques   Goals Progress/Improvement seen  Yes    Increase knowledge of respiratory medications   Goals Progress/Improvement seen  Yes      Nutrition & Weight - Outcomes:   GD04 Depression Questionnaire Results      Pre 4    Post 7   SF36                                                                   Pre           Post             Physical Fx     16         18                Role Fx: Physical    4   5              Bodily Pain     6   9    General Health    12   12   Vitality      14              17    Social Fx     6   9   Role Fx: Emotional    6   6   Mental Health                          25   25  Nutrition:   Nutrition Discharge:   Education Questionnaire Score:   Goals reviewed with patient; copy given to patient.

## 2014-12-02 NOTE — Progress Notes (Signed)
Daily Session Note  Patient Details  Name: Cindy Cole MRN: [id number] Date of Birth: [birth year] Referring Provider:  Tedra Coupe, MD  Encounter Date: 12/02/2014  Check In:     Session Check In - 12/02/14 1019    Check-In   Staff Present Laureen Owens Shark BS, RRT, Respiratory Therapist;Roena Sassaman BS, ACSM EP-C, Exercise Physiologist;Renee Dillard Essex MS, ACSM CEP Exercise Physiologist   ER physicians immediately available to respond to emergencies [facility] immediately available ER MD   Physician(s) Edd Fabian and Jimmye Norman   Medication changes reported     No   Fall or balance concerns reported    No   Warm-up and Cool-down Performed on first and last piece of equipment   Pain Assessment   Currently in Pain? No/denies         Goals Met:  Proper associated with RPD/PD & O2 Sat Exercise tolerated well No report of cardiac concerns or symptoms Strength training completed today  Goals Unmet:  Not Applicable  Goals Comments:    Dr. Emily Filbert is Medical Director for [place] and [facility].

## 2015-03-06 IMAGING — CR DG CHEST 1V PORT
1 series · 1 of 1 positions shown · non-contrast
Comparison: Film earlier today at 0740 hr

CLINICAL DATA: Central line placement.  Respiratory failure.

EXAM:
PORTABLE CHEST - 1 VIEW

[ap]
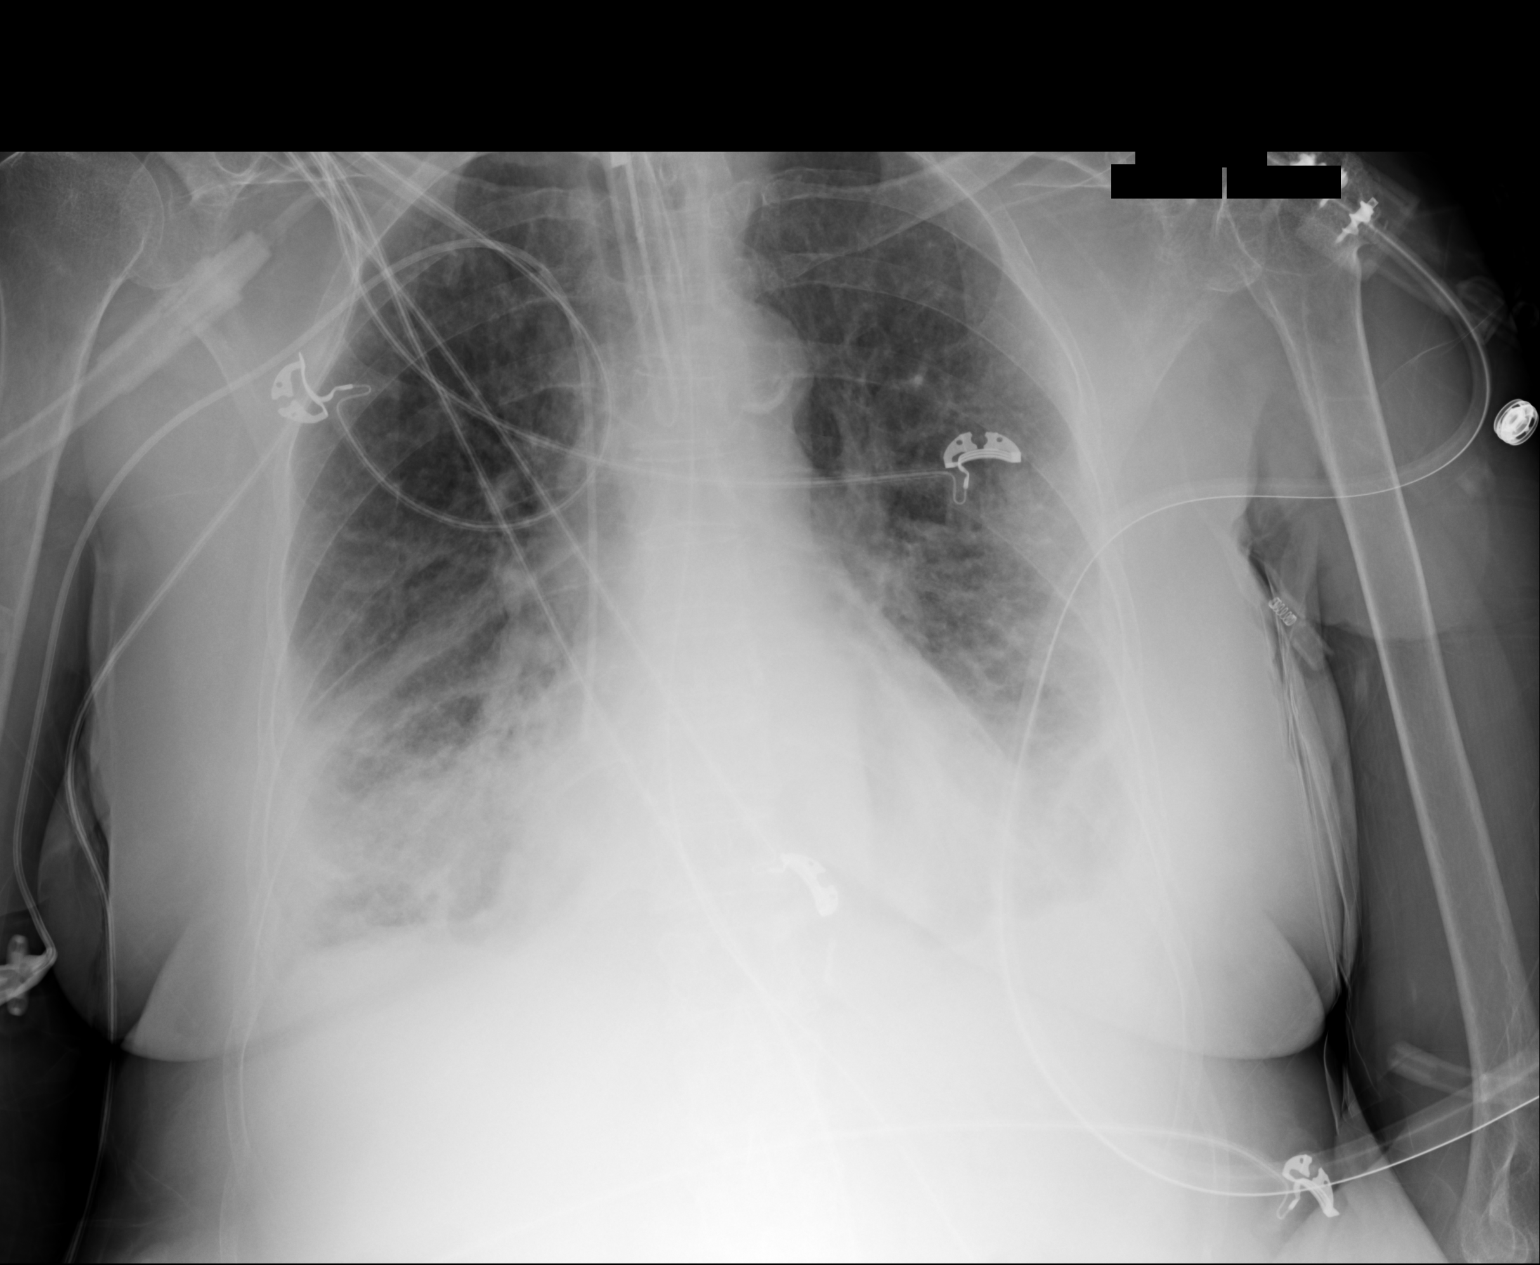

[1 of 1 positions shown; findings below may reference images not displayed]

FINDINGS: Endotracheal tube tip lies approximately 1.7 cm above the carina.
Interval placement of right arm PICC line with the catheter tip at
the cavoatrial junction. Lungs show persistent bibasilar airspace
disease without significant change. Associated small bilateral
pleural effusions, left greater than right. No pneumothorax. The
heart size is stable and normal.
IMPRESSION: Interval PICC line placement with the tip at the cavoatrial
junction. No significant change in bibasilar airspace disease and
bilateral pleural effusions.

## 2015-03-06 IMAGING — CR DG CHEST 1V PORT
1 series · 1 of 1 positions shown · non-contrast
Comparison: Earlier today at 4040 hr.

CLINICAL DATA: Status post intubation.

EXAM:
PORTABLE CHEST - 1 VIEW

[portable]
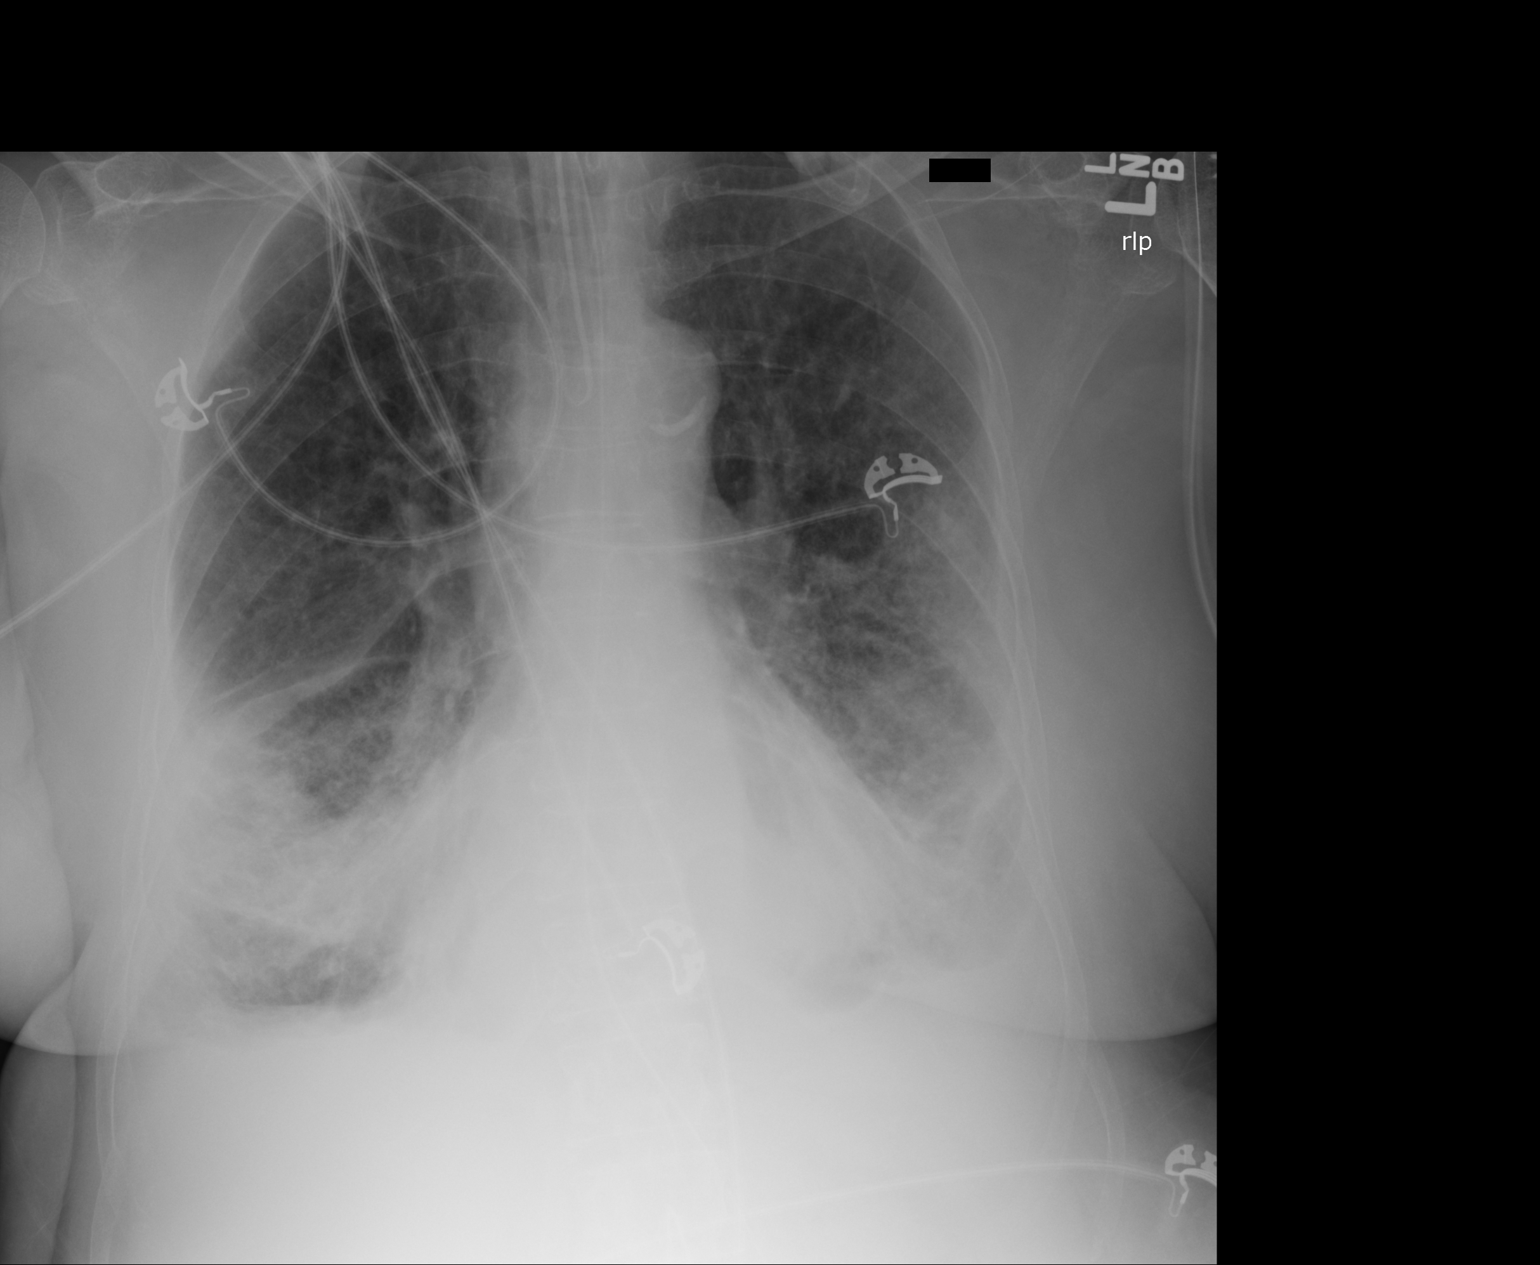

[1 of 1 positions shown; findings below may reference images not displayed]

FINDINGS: 0716 hr. Endotracheal tube 3.2 cm above carina. Nasogastric tube
extends beyond the inferior aspect of the film.

Numerous leads and wires project over the chest. Cardiomegaly
accentuated by AP portable technique. Transverse aortic
atherosclerosis. Small bilateral pleural effusions. No pneumothorax.
Mild interstitial edema. Minimal improvement in patchy bibasilar
airspace disease.
IMPRESSION: 1. Appropriate position of endotracheal tube.
2. Mild congestive heart failure with bilateral pleural effusions.
3. Slight improvement in bibasilar aeration. This could represent
atelectasis or infection/aspiration.

## 2015-04-17 DEATH — deceased
